# Patient Record
Sex: Female | Born: 1960 | Race: White | Hispanic: No | Marital: Single | Smoking: Never smoker
Health system: Southern US, Community
[De-identification: ages and names within clinical notes are randomized; demographics above are authoritative.]

## PROBLEM LIST (undated history)

## (undated) DIAGNOSIS — E079 Disorder of thyroid, unspecified: Secondary | ICD-10-CM

## (undated) DIAGNOSIS — B029 Zoster without complications: Secondary | ICD-10-CM

## (undated) HISTORY — PX: TUBAL LIGATION: SHX77

## (undated) HISTORY — DX: Zoster without complications: B02.9

## (undated) HISTORY — PX: SKIN CANCER DESTRUCTION: SHX778

---

## 2008-06-08 ENCOUNTER — Ambulatory Visit (HOSPITAL_COMMUNITY): Admission: RE | Admit: 2008-06-08 | Discharge: 2008-06-08 | Payer: Self-pay | Admitting: Family Medicine

## 2011-01-06 ENCOUNTER — Encounter: Payer: Self-pay | Admitting: Family Medicine

## 2014-07-04 DIAGNOSIS — C4491 Basal cell carcinoma of skin, unspecified: Secondary | ICD-10-CM

## 2014-07-04 HISTORY — DX: Basal cell carcinoma of skin, unspecified: C44.91

## 2014-08-22 ENCOUNTER — Ambulatory Visit (HOSPITAL_COMMUNITY)
Admission: RE | Admit: 2014-08-22 | Discharge: 2014-08-22 | Disposition: A | Discharge status: Home / Self Care | Payer: Managed Care, Other (non HMO) | Source: Ambulatory Visit | Attending: Family Medicine | Admitting: Family Medicine

## 2014-08-22 ENCOUNTER — Other Ambulatory Visit (HOSPITAL_COMMUNITY): Payer: Self-pay | Admitting: Family Medicine

## 2014-08-22 ENCOUNTER — Encounter (HOSPITAL_COMMUNITY): Payer: Self-pay

## 2014-08-22 DIAGNOSIS — R29898 Other symptoms and signs involving the musculoskeletal system: Secondary | ICD-10-CM

## 2014-08-22 DIAGNOSIS — M545 Low back pain, unspecified: Secondary | ICD-10-CM | POA: Diagnosis present

## 2014-08-22 DIAGNOSIS — M79604 Pain in right leg: Secondary | ICD-10-CM

## 2014-08-22 DIAGNOSIS — M79609 Pain in unspecified limb: Secondary | ICD-10-CM | POA: Insufficient documentation

## 2014-08-22 DIAGNOSIS — M5137 Other intervertebral disc degeneration, lumbosacral region: Secondary | ICD-10-CM | POA: Insufficient documentation

## 2014-08-22 DIAGNOSIS — M51379 Other intervertebral disc degeneration, lumbosacral region without mention of lumbar back pain or lower extremity pain: Secondary | ICD-10-CM | POA: Insufficient documentation

## 2015-02-07 ENCOUNTER — Emergency Department (HOSPITAL_COMMUNITY)
Admission: EM | Admit: 2015-02-07 | Discharge: 2015-02-07 | Disposition: A | Discharge status: Home / Self Care | Payer: Managed Care, Other (non HMO) | Attending: Emergency Medicine | Admitting: Emergency Medicine

## 2015-02-07 ENCOUNTER — Encounter (HOSPITAL_COMMUNITY): Payer: Self-pay | Admitting: Emergency Medicine

## 2015-02-07 ENCOUNTER — Emergency Department (HOSPITAL_COMMUNITY): Payer: Managed Care, Other (non HMO)

## 2015-02-07 DIAGNOSIS — E079 Disorder of thyroid, unspecified: Secondary | ICD-10-CM | POA: Diagnosis not present

## 2015-02-07 DIAGNOSIS — R42 Dizziness and giddiness: Secondary | ICD-10-CM | POA: Diagnosis not present

## 2015-02-07 DIAGNOSIS — Z79899 Other long term (current) drug therapy: Secondary | ICD-10-CM | POA: Insufficient documentation

## 2015-02-07 DIAGNOSIS — R0789 Other chest pain: Secondary | ICD-10-CM | POA: Insufficient documentation

## 2015-02-07 DIAGNOSIS — R079 Chest pain, unspecified: Secondary | ICD-10-CM | POA: Diagnosis present

## 2015-02-07 HISTORY — DX: Disorder of thyroid, unspecified: E07.9

## 2015-02-07 LAB — TROPONIN I

## 2015-02-07 LAB — CBC WITH DIFFERENTIAL/PLATELET
BASOS ABS: 0 10*3/uL (ref 0.0–0.1)
BASOS PCT: 0 % (ref 0–1)
EOS ABS: 0.1 10*3/uL (ref 0.0–0.7)
Eosinophils Relative: 1 % (ref 0–5)
HEMATOCRIT: 39.3 % (ref 36.0–46.0)
HEMOGLOBIN: 13.3 g/dL (ref 12.0–15.0)
LYMPHS PCT: 7 % — AB (ref 12–46)
Lymphs Abs: 0.8 10*3/uL (ref 0.7–4.0)
MCH: 32.5 pg (ref 26.0–34.0)
MCHC: 33.8 g/dL (ref 30.0–36.0)
MCV: 96.1 fL (ref 78.0–100.0)
MONOS PCT: 10 % (ref 3–12)
Monocytes Absolute: 1.1 10*3/uL — ABNORMAL HIGH (ref 0.1–1.0)
NEUTROS ABS: 9.6 10*3/uL — AB (ref 1.7–7.7)
Neutrophils Relative %: 82 % — ABNORMAL HIGH (ref 43–77)
Platelets: 224 10*3/uL (ref 150–400)
RBC: 4.09 MIL/uL (ref 3.87–5.11)
RDW: 12.4 % (ref 11.5–15.5)
WBC: 11.6 10*3/uL — ABNORMAL HIGH (ref 4.0–10.5)

## 2015-02-07 LAB — COMPREHENSIVE METABOLIC PANEL
ALT: 18 U/L (ref 0–35)
ANION GAP: 10 (ref 5–15)
AST: 20 U/L (ref 0–37)
Albumin: 4.2 g/dL (ref 3.5–5.2)
Alkaline Phosphatase: 61 U/L (ref 39–117)
BILIRUBIN TOTAL: 0.7 mg/dL (ref 0.3–1.2)
BUN: 15 mg/dL (ref 6–23)
CALCIUM: 8.8 mg/dL (ref 8.4–10.5)
CHLORIDE: 102 mmol/L (ref 96–112)
CO2: 28 mmol/L (ref 19–32)
CREATININE: 0.68 mg/dL (ref 0.50–1.10)
Glucose, Bld: 108 mg/dL — ABNORMAL HIGH (ref 70–99)
Potassium: 3.8 mmol/L (ref 3.5–5.1)
Sodium: 140 mmol/L (ref 135–145)
Total Protein: 7.3 g/dL (ref 6.0–8.3)

## 2015-02-07 NOTE — ED Notes (Signed)
PT c/o centralized chest pain on inhalation x2 days and states pain radiates into her back.

## 2015-02-07 NOTE — ED Provider Notes (Signed)
CSN: [id number]     Arrival date & time 02/07/15  1217 History  This chart was scribed for Richarda Blade, MD by Rayfield Citizen, ED Scribe. This patient was seen in room APA07/APA07 and the patient's care was started at 1:34 PM.    Chief Complaint  Patient presents with  . Chest Pain   Patient is a 54 y.o. female presenting with chest pain. The history is provided by the patient. No language interpreter was used.  Chest Pain Associated symptoms: dizziness   Associated symptoms: no nausea and not vomiting      HPI Comments: Kayla Patel is a 54 y.o. female with past medical history of thyroid disease who presents to the Emergency Department complaining of 2 days of centralized chest "tightness", worse on inhalation and radiating to her back. She reports occasional dizziness. Patient explains she took BC powders and Aleve without relief. She denies nausea, vomiting, urinary symptoms.   She reports an episode of "severe" chest congestion last month; her cough has since improved, though she worries her current symptoms may be related. She treated her symptoms at that time with Mucinex.   PCP is McInnis. She is a nonsmoker; she denies significant or abnormal stress. 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   Past Medical History  Diagnosis Date  . Thyroid disease    Past Surgical History  Procedure Laterality Date  . Tubal ligation     No family history on file. History  Substance Use Topics  . Smoking status: Never Smoker   . Smokeless tobacco: Not on file  . Alcohol Use: No   OB History    Gravida Para Term Preterm AB TAB SAB Ectopic Multiple Living            2     Review of Systems  Cardiovascular: Positive for chest pain.  Gastrointestinal: Negative for nausea and vomiting.  Genitourinary: Negative for dysuria and hematuria.  Neurological: Positive for dizziness.   Allergies  Review of patient's  allergies indicates no known allergies.  Home Medications   Prior to Admission medications   Medication Sig Start Date End Date Taking? Authorizing Provider  Aspirin-Salicylamide-Caffeine (BC HEADACHE POWDER PO) Take 1 Package by mouth daily as needed (pain).   Yes Historical Provider, MD  cyclobenzaprine (FLEXERIL) 10 MG tablet Take 1 tablet by mouth daily as needed. 02/04/15  Yes Historical Provider, MD  naproxen sodium (ANAPROX) 220 MG tablet Take 220 mg by mouth daily as needed (pain).   Yes Historical Provider, MD  SYNTHROID 75 MCG tablet Take 75 mcg by mouth daily. 01/10/15  Yes Historical Provider, MD   BP 126/79 mmHg  Pulse 105  Temp(Src) 99 F (37.2 C) (Oral)  Resp 18  Ht 5\' 5"  (1.651 m)  Wt 160 lb (72.576 kg)  BMI 26.63 kg/m2  SpO2 100%  LMP 01/24/2015 Physical Exam  Constitutional: She is oriented to person, place, and time. She appears well-developed and well-nourished.  HENT:  Head: Normocephalic and atraumatic.  Eyes: Conjunctivae and EOM are normal. Pupils are equal, round, and reactive to light.  Neck: Normal range of motion and phonation normal. Neck supple.  Cardiovascular: Normal rate and regular rhythm.   Pulmonary/Chest: Effort normal and breath sounds normal. She has no wheezes. She has no rales. She exhibits tenderness (Mild bilateral anterior).  Abdominal: Soft. She exhibits no distension. There is no tenderness. There is no guarding.  Musculoskeletal: Normal range of  motion.  Neurological: She is alert and oriented to person, place, and time. She exhibits normal muscle tone.  Skin: Skin is warm and dry.  Psychiatric: Her behavior is normal. Judgment and thought content normal. Her mood appears anxious.  Nursing note and vitals reviewed.   ED Course  Procedures   DIAGNOSTIC STUDIES: Oxygen Saturation is 100% on RA, normal by my interpretation.    COORDINATION OF CARE:  Medications - No data to display  Patient Vitals for the past 24 hrs:  BP Temp  Temp src Pulse Resp SpO2 Height Weight  02/07/15 1230 126/79 mmHg 99 F (37.2 C) Oral 105 18 100 % 5\' 5"  (1.651 m) 160 lb (72.576 kg)   1:40 PM Discussed treatment plan with pt at bedside and pt agreed to plan.   1:51 PM Reevaluation with update and discussion. After initial assessment and treatment, an updated evaluation reveals no further complaints.  Findings discussed with patient, all questions answered. [place] Review Labs Reviewed  CBC WITH DIFFERENTIAL/PLATELET - Abnormal; Notable for the following:    WBC 11.6 (*)    Neutrophils Relative % 82 (*)    Neutro Abs 9.6 (*)    Lymphocytes Relative 7 (*)    Monocytes Absolute 1.1 (*)    All other components within normal limits  COMPREHENSIVE METABOLIC PANEL - Abnormal; Notable for the following:    Glucose, Bld 108 (*)    All other components within normal limits  TROPONIN I   Imaging Review Dg Chest 2 View  02/07/2015   CLINICAL DATA:  Chest pain radiating to back for 1 day  EXAM: CHEST  2 VIEW  COMPARISON:  None.  FINDINGS: There is no edema or consolidation. The heart size and pulmonary vascularity are normal. No adenopathy. No bone lesions.  IMPRESSION: No edema or consolidation.   Electronically Signed   By: Lowella Grip III M.D.   On: 02/07/2015 13:32     EKG Interpretation  Date/Time:  Wednesday February 07 2015 12:25:34 EST Ventricular Rate:  106 PR Interval:  150 QRS Duration: 92 QT Interval:  346 QTC Calculation: 459 R Axis:   72 Text Interpretation:  Sinus tachycardia Cannot rule out Anterior infarct , age undetermined Abnormal ECG No old tracing to compare Confirmed by [facility]  MD, Courtnei Ruddell [id number]) on 02/07/2015 1:30:26 PM      MDM   Final diagnoses:  Chest wall pain   Evaluation consistent with chest wall pain.  Doubt ACS, PE or pneumonia.  Nursing Notes Reviewed/ Care Coordinated Applicable Imaging Reviewed Interpretation of Laboratory Data incorporated into ED treatment  The  patient appears reasonably screened and/or stabilized for discharge and I doubt any other medical condition or other [facility] requiring further screening, evaluation, or treatment in the ED at this time prior to discharge.  Plan: Home Medications- OTC analgesia; Home Treatments- rest, heat; return here if the recommended treatment, does not improve the symptoms; Recommended follow up- PCP prn   I personally performed the services described in this documentation, which was scribed in my presence. The recorded information has been reviewed and is accurate.       Richarda Blade, MD 02/07/15 [phone]

## 2015-02-07 NOTE — Discharge Instructions (Signed)
°  Take  Ibuprofen 400 mg 3 times a day for pain Use heat on the sore area 3 times a day   Chest Wall Pain Chest wall pain is pain in or around the bones and muscles of your chest. It may take up to 6 weeks to get better. It may take longer if you must stay physically active in your work and activities.  CAUSES  Chest wall pain may happen on its own. However, it may be caused by:  A viral illness like the flu.  Injury.  Coughing.  Exercise.  Arthritis.  Fibromyalgia.  Shingles. HOME CARE INSTRUCTIONS   Avoid overtiring physical activity. Try not to strain or perform activities that cause pain. This includes any activities using your chest or your abdominal and side muscles, especially if heavy weights are used.  Put ice on the sore area.  Put ice in a plastic bag.  Place a towel between your skin and the bag.  Leave the ice on for 15-20 minutes per hour while awake for the first 2 days.  Only take over-the-counter or prescription medicines for pain, discomfort, or fever as directed by your caregiver. SEEK IMMEDIATE MEDICAL CARE IF:   Your pain increases, or you are very uncomfortable.  You have a fever.  Your chest pain becomes worse.  You have new, unexplained symptoms.  You have nausea or vomiting.  You feel sweaty or lightheaded.  You have a cough with phlegm (sputum), or you cough up blood. MAKE SURE YOU:   Understand these instructions.  Will watch your condition.  Will get help right away if you are not doing well or get worse. Document Released: 12/01/2005 Document Revised: 02/23/2012 Document Reviewed: 07/28/2011 [facility] Patient Information 2015 [place], [place]. This information is not intended to replace advice given to you by your health care provider. Make sure you discuss any questions you have with your health care provider.

## 2015-02-13 ENCOUNTER — Encounter: Payer: Self-pay | Admitting: Obstetrics & Gynecology

## 2015-03-15 ENCOUNTER — Encounter: Payer: Self-pay | Admitting: Obstetrics & Gynecology

## 2015-04-18 ENCOUNTER — Ambulatory Visit (AMBULATORY_SURGERY_CENTER): Payer: Self-pay

## 2015-04-18 VITALS — Ht 65.5 in | Wt 154.0 lb

## 2015-04-18 DIAGNOSIS — Z1211 Encounter for screening for malignant neoplasm of colon: Secondary | ICD-10-CM

## 2015-04-18 MED ORDER — MOVIPREP 100 G PO SOLR: 1.0000 | Freq: Once | ORAL | Status: DC

## 2015-04-18 NOTE — Progress Notes (Signed)
No allergies to eggs or soy No diet/weight loss meds No home oxygen No past problems with anesthesia   

## 2015-04-19 ENCOUNTER — Encounter: Payer: Self-pay | Admitting: Internal Medicine

## 2015-05-02 ENCOUNTER — Encounter: Payer: Managed Care, Other (non HMO) | Admitting: Internal Medicine

## 2015-05-11 ENCOUNTER — Encounter: Payer: Self-pay | Admitting: Internal Medicine

## 2015-05-11 ENCOUNTER — Ambulatory Visit (AMBULATORY_SURGERY_CENTER): Payer: Managed Care, Other (non HMO) | Admitting: Internal Medicine

## 2015-05-11 VITALS — BP 117/72 | HR 67 | Temp 96.9°F | Resp 25 | Ht 65.5 in | Wt 154.0 lb

## 2015-05-11 DIAGNOSIS — Z1211 Encounter for screening for malignant neoplasm of colon: Secondary | ICD-10-CM | POA: Diagnosis present

## 2015-05-11 MED ORDER — SODIUM CHLORIDE 0.9 % IV SOLN: 500.0000 mL | INTRAVENOUS | Status: DC

## 2015-05-11 NOTE — Patient Instructions (Signed)
YOU HAD AN ENDOSCOPIC PROCEDURE TODAY AT [facility]:   Refer to the procedure report that was given to you for any specific questions about what was found during the examination.  If the procedure report does not answer your questions, please call your gastroenterologist to clarify.  If you requested that your care partner not be given the details of your procedure findings, then the procedure report has been included in a sealed envelope for you to review at your convenience later.  YOU SHOULD EXPECT: Some feelings of bloating in the abdomen. Passage of more gas than usual.  Walking can help get rid of the air that was put into your GI tract during the procedure and reduce the bloating. If you had a lower endoscopy (such as a colonoscopy or flexible sigmoidoscopy) you may notice spotting of blood in your stool or on the toilet paper. If you underwent a bowel prep for your procedure, you may not have a normal bowel movement for a few days.  Please Note:  You might notice some irritation and congestion in your nose or some drainage.  This is from the oxygen used during your procedure.  There is no need for concern and it should clear up in a day or so.  SYMPTOMS TO REPORT IMMEDIATELY:   Following lower endoscopy (colonoscopy or flexible sigmoidoscopy):  Excessive amounts of blood in the stool  Significant tenderness or worsening of abdominal pains  Swelling of the abdomen that is new, acute  Fever of 100F or higher   For urgent or emergent issues, a gastroenterologist can be reached at any hour by calling [phone].   DIET: Your first meal following the procedure should be a small meal and then it is ok to progress to your normal diet. Heavy or fried foods are harder to digest and may make you feel nauseous or bloated.  Likewise, meals heavy in dairy and vegetables can increase bloating.  Drink plenty of fluids but you should avoid alcoholic beverages for 24  hours.  ACTIVITY:  You should plan to take it easy for the rest of today and you should NOT DRIVE or use heavy machinery until tomorrow (because of the sedation medicines used during the test).    FOLLOW UP: Our staff will call the number listed on your records the next business day following your procedure to check on you and address any questions or concerns that you may have regarding the information given to you following your procedure. If we do not reach you, we will leave a message.  However, if you are feeling well and you are not experiencing any problems, there is no need to return our call.  We will assume that you have returned to your regular daily activities without incident.  If any biopsies were taken you will be contacted by phone or by letter within the next 1-3 weeks.  Please call us at [phone] if you have not heard about the biopsies in 3 weeks.    SIGNATURES/CONFIDENTIALITY: You and/or your care partner have signed paperwork which will be entered into your electronic medical record.  These signatures attest to the fact that that the information above on your After Visit Summary has been reviewed and is understood.  Full responsibility of the confidentiality of this discharge information lies with you and/or your care-partner.  Diverticulosis and high fiber diet information given.

## 2015-05-11 NOTE — Op Note (Signed)
[place]  [address] [place], [id number]   COLONOSCOPY PROCEDURE REPORT  PATIENT: Kayla Patel, Kayla Patel  MR#: [id number] BIRTHDATE: [birth year] , 34  yrs. old GENDER: female ENDOSCOPIST: Lafayette Dragon, MD REFERRED YS:HUOHF Everette Rank, M.D. PROCEDURE DATE:  05/11/2015 PROCEDURE:   Colonoscopy, screening First Screening Colonoscopy - Avg.  risk and is 50 yrs.  old or older Yes.  Prior Negative Screening - Now for repeat screening. N/A  History of Adenoma - Now for follow-up colonoscopy & has been > or = to 3 yrs.  N/A  Polyps removed today? No Recommend repeat exam, <10 yrs? No ASA CLASS:   Class I INDICATIONS:Screening for colonic neoplasia and Colorectal Neoplasm Risk Assessment for this procedure is average risk. MEDICATIONS: Monitored anesthesia care and Propofol 200 mg IV  DESCRIPTION OF PROCEDURE:   After the risks benefits and alternatives of the procedure were thoroughly explained, informed consent was obtained.  The digital rectal exam revealed no abnormalities of the rectum.   The LB [id number]-[id number]  endoscope was introduced through the anus and advanced to the cecum, which was identified by both the appendix and ileocecal valve. No adverse events experienced.   The quality of the prep was good.  (MoviPrep was used)  The instrument was then slowly withdrawn as the colon was fully examined. Estimated blood loss is zero unless otherwise noted in this procedure report.      COLON FINDINGS: There was moderate diverticulosis noted in the sigmoid colon and descending colon with associated muscular hypertrophy, tortuosity and angulation.  Retroflexed views revealed no abnormalities. The time to cecum = 5.54 Withdrawal time = 6.05 The scope was withdrawn and the procedure completed. COMPLICATIONS: There were no immediate complications.  ENDOSCOPIC IMPRESSION: There was moderate diverticulosis noted in the sigmoid colon and descending  colon  RECOMMENDATIONS: High fiber diet Recall colonoscopy in 10 years  eSigned:  Lafayette Dragon, MD 05/11/2015 10:14 AM   cc:

## 2015-05-11 NOTE — Progress Notes (Signed)
A/ox3 pleased with MAC, report to 

## 2015-05-15 ENCOUNTER — Telehealth: Payer: Self-pay | Admitting: *Deleted

## 2015-05-15 NOTE — Telephone Encounter (Signed)
No answer, left message to call if questions or concerns. 

## 2016-06-19 ENCOUNTER — Encounter: Payer: Self-pay | Admitting: Obstetrics & Gynecology

## 2016-11-07 IMAGING — CR DG CHEST 2V
2 series · 2 of 2 positions shown · non-contrast
Comparison: None.

CLINICAL DATA: Chest pain radiating to back for 1 day

EXAM:
CHEST  2 VIEW

[view not recorded (1 of 2)]
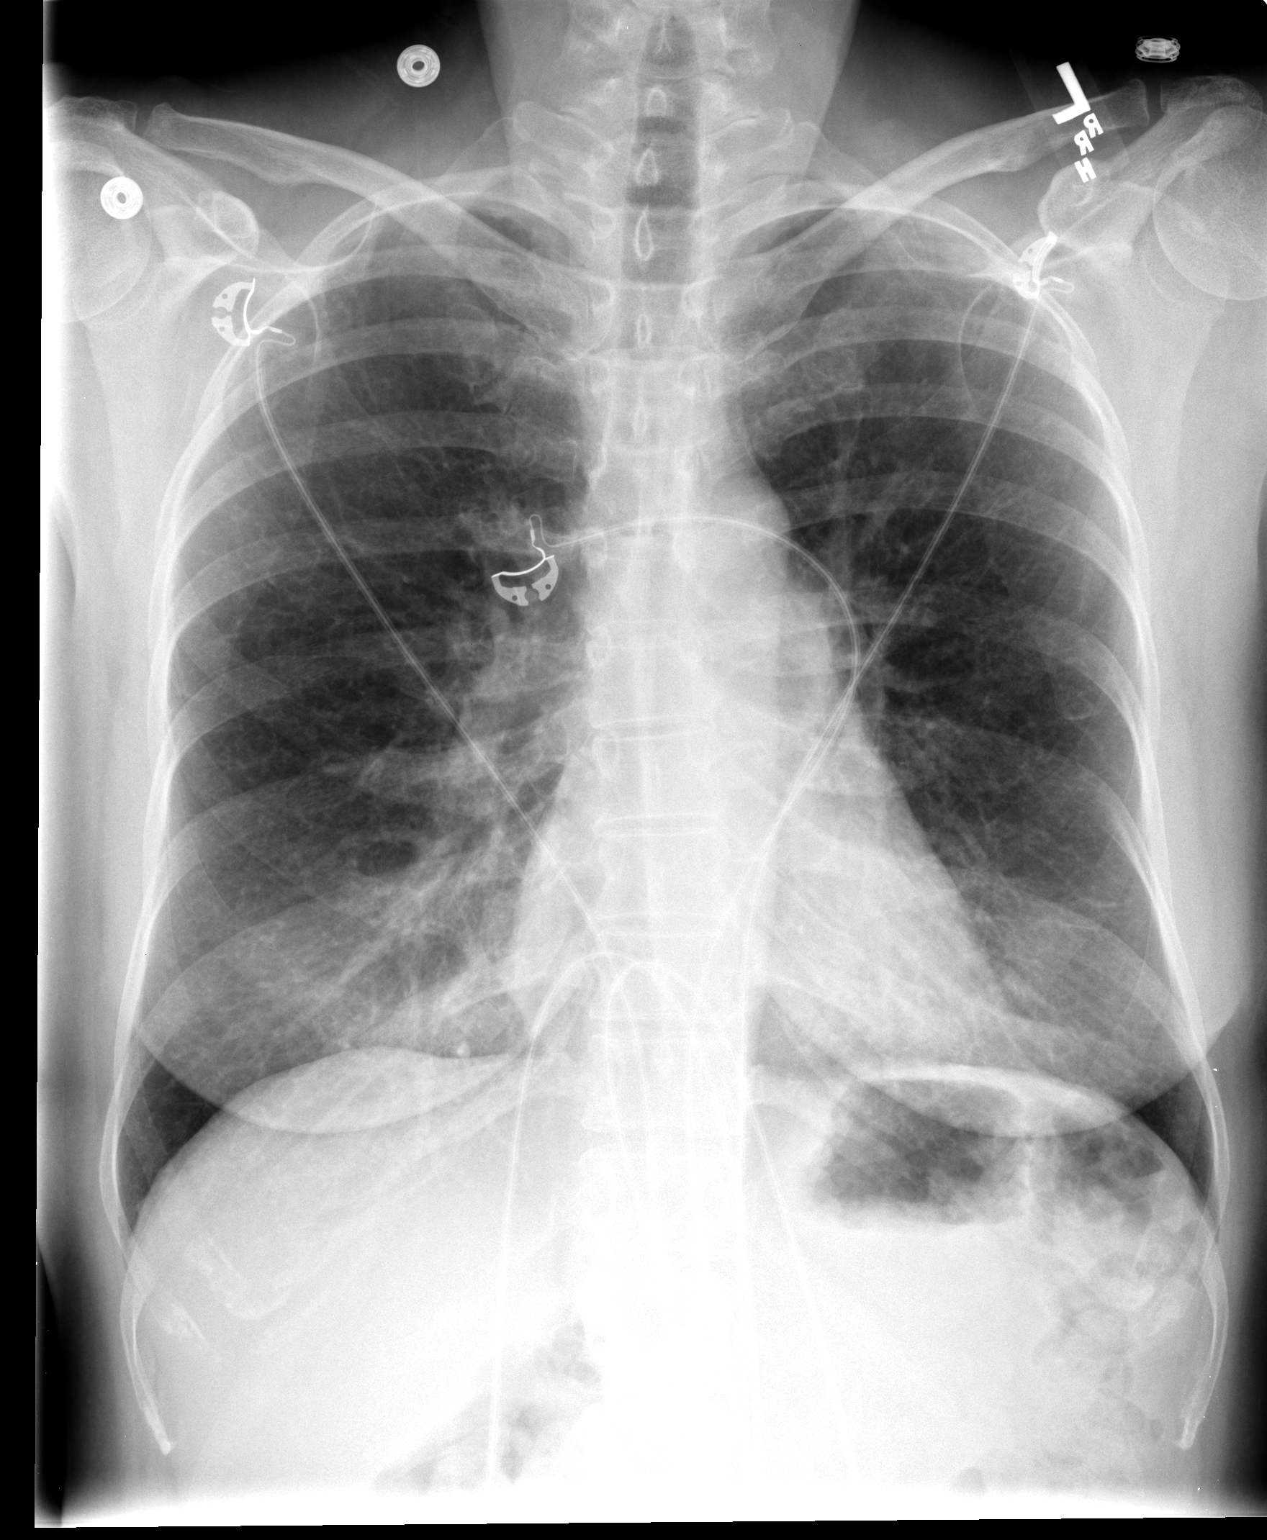

[view not recorded (2 of 2)]
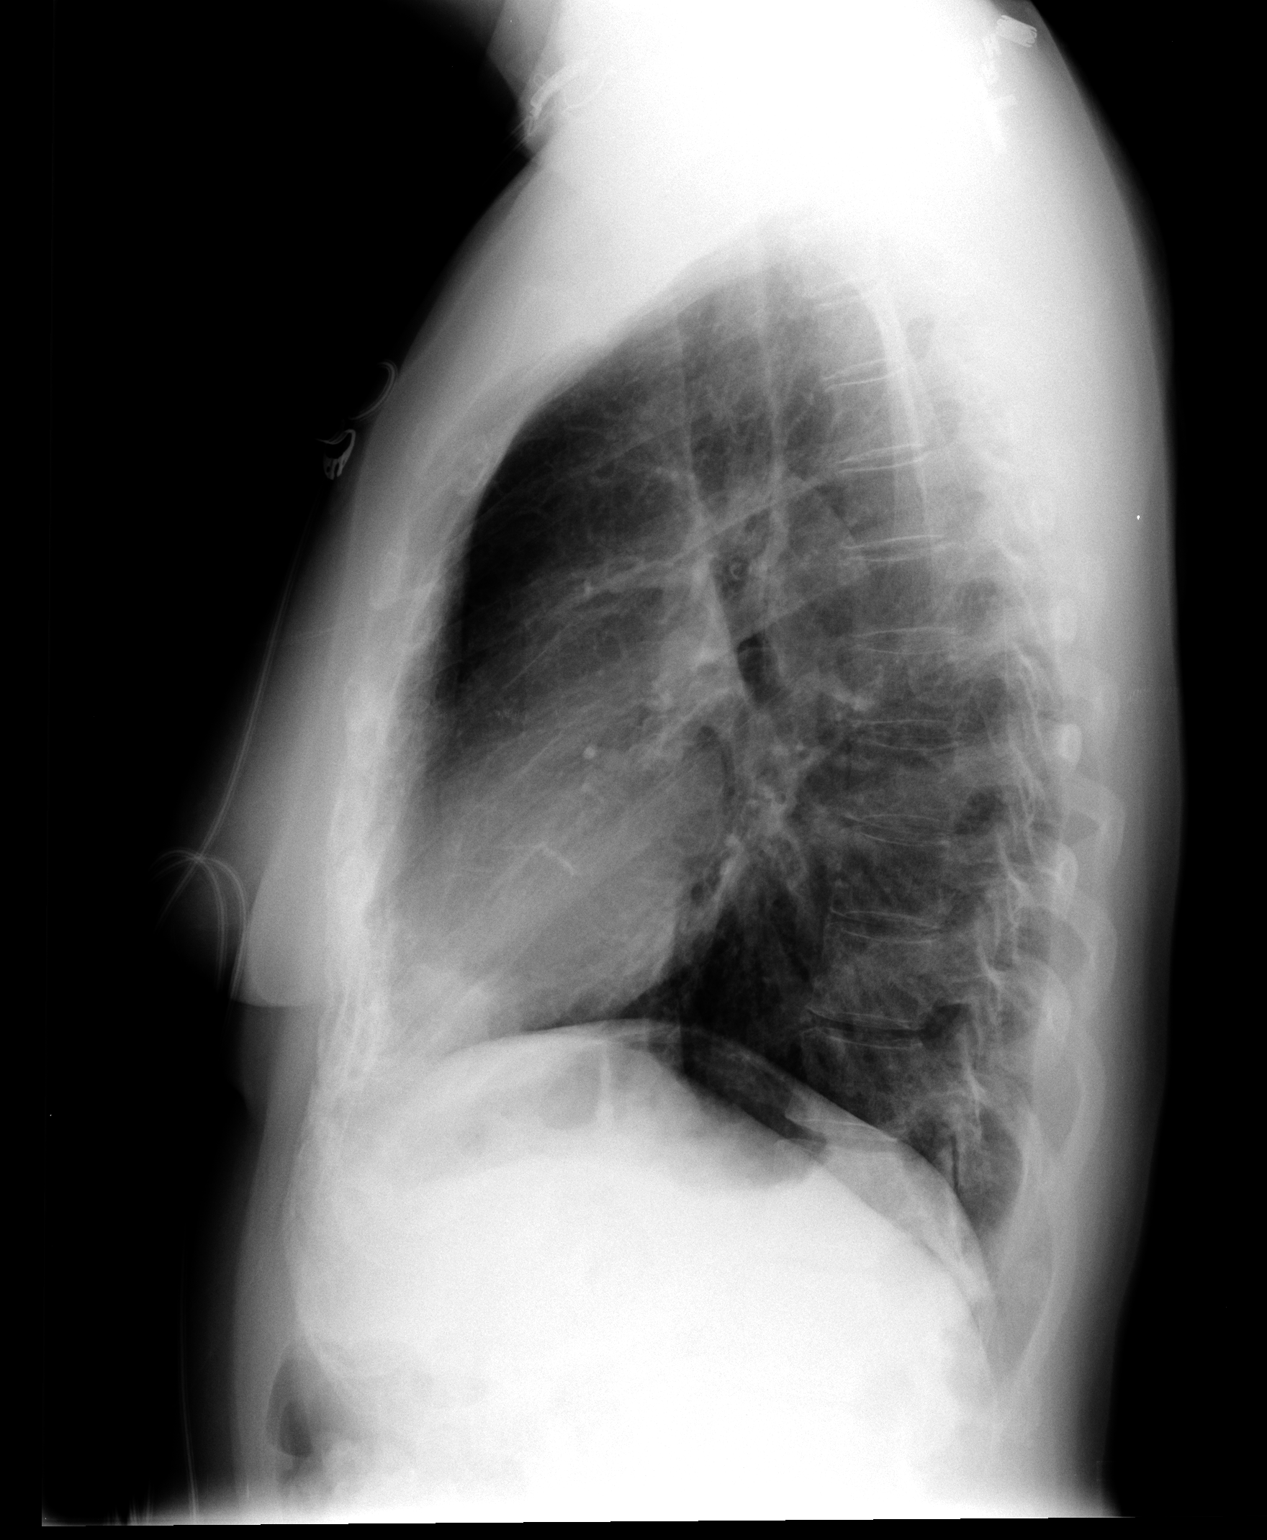

[2 of 2 positions shown; findings below may reference images not displayed]

FINDINGS: There is no edema or consolidation. The heart size and pulmonary
vascularity are normal. No adenopathy. No bone lesions.
IMPRESSION: No edema or consolidation.

## 2018-05-07 ENCOUNTER — Encounter: Payer: Self-pay | Admitting: Obstetrics & Gynecology

## 2018-07-23 ENCOUNTER — Encounter: Payer: Self-pay | Admitting: Obstetrics & Gynecology

## 2018-08-09 ENCOUNTER — Telehealth: Payer: Self-pay | Admitting: *Deleted

## 2018-08-09 NOTE — Telephone Encounter (Signed)
Annual on 09/20/18, patient called requesting refill on Valtrex 1 gram, paper chart has arrived. I left message to call to find out what pharmacy to send Rx.

## 2018-08-10 MED ORDER — VALACYCLOVIR HCL 1 G PO TABS: ORAL_TABLET | ORAL | 0 refills | Status: DC

## 2018-08-10 NOTE — Telephone Encounter (Signed)
Patient called back with pharmacy.

## 2018-09-17 ENCOUNTER — Encounter: Payer: Self-pay | Admitting: Obstetrics & Gynecology

## 2018-09-20 ENCOUNTER — Encounter: Payer: Self-pay | Admitting: Obstetrics & Gynecology

## 2018-10-22 ENCOUNTER — Ambulatory Visit (INDEPENDENT_AMBULATORY_CARE_PROVIDER_SITE_OTHER): Payer: BLUE CROSS/BLUE SHIELD | Admitting: Obstetrics & Gynecology

## 2018-10-22 ENCOUNTER — Encounter: Payer: Self-pay | Admitting: Obstetrics & Gynecology

## 2018-10-22 VITALS — BP 132/84 | Ht 63.0 in | Wt 164.0 lb

## 2018-10-22 DIAGNOSIS — Z1151 Encounter for screening for human papillomavirus (HPV): Secondary | ICD-10-CM | POA: Diagnosis not present

## 2018-10-22 DIAGNOSIS — E663 Overweight: Secondary | ICD-10-CM | POA: Diagnosis not present

## 2018-10-22 DIAGNOSIS — Z780 Asymptomatic menopausal state: Secondary | ICD-10-CM

## 2018-10-22 DIAGNOSIS — Z01419 Encounter for gynecological examination (general) (routine) without abnormal findings: Secondary | ICD-10-CM

## 2018-10-22 NOTE — Patient Instructions (Signed)
1. Encounter for routine gynecological examination with Papanicolaou smear of cervix Normal gynecologic exam.  Pap with high-risk HPV done today.  Breast exam normal.  Will schedule screening mammogram at the breast center now.  Health labs with family physician.  2. Postmenopausal Well on no hormone replacement therapy.  No postmenopausal bleeding.  Recommend vitamin D supplements, calcium intake of 1.5 g/day and regular weightbearing physical activity.  3. Overweight (BMI 25.0-29.9) Lower calorie/carb diet such as [employer].  Aerobic physical activity 5 times a week and weightlifting every 2 days.  Jessilynn, it was a pleasure seeing you today!  I will inform you of your results as soon as they are available.

## 2018-10-22 NOTE — Progress Notes (Signed)
Kayla Patel 1961/08/26 [id number]   History:    57 y.o. G2P2L2 Married.  Has 5 grand-children.  RP:  Established patient presenting for annual gyn exam   HPI: Menopause, well on no HRT.  No PMB.  No pelvic pain.  No pain with IC.  Breasts wnl.  Urine/BMs wnl.  BMI 29.05.  Mild physical activity.  Health Labs with Fam MD.  Past medical history,surgical history, family history and social history were all reviewed and documented in the EPIC chart.  Gynecologic History Patient's last menstrual period was 01/24/2015. Contraception: post menopausal status Last Pap: 3 yrs ago, normal  Last mammogram: 3 yrs ago, normal Bone Density: Never Colonoscopy: 2016  Obstetric History OB History  Gravida Para Term Preterm AB Living  2 2       2   SAB TAB Ectopic Multiple Live Births               # Outcome Date GA Lbr Len/2nd Weight Sex Delivery Anes PTL Lv  2 Para           1 Para              ROS: A ROS was performed and pertinent positives and negatives are included in the history.  GENERAL: No fevers or chills. HEENT: No change in vision, no earache, sore throat or sinus congestion. NECK: No pain or stiffness. CARDIOVASCULAR: No chest pain or pressure. No palpitations. PULMONARY: No shortness of breath, cough or wheeze. GASTROINTESTINAL: No abdominal pain, nausea, vomiting or diarrhea, melena or bright red blood per rectum. GENITOURINARY: No urinary frequency, urgency, hesitancy or dysuria. MUSCULOSKELETAL: No joint or muscle pain, no back pain, no recent trauma. DERMATOLOGIC: No rash, no itching, no lesions. ENDOCRINE: No polyuria, polydipsia, no heat or cold intolerance. No recent change in weight. HEMATOLOGICAL: No anemia or easy bruising or bleeding. NEUROLOGIC: No headache, seizures, numbness, tingling or weakness. PSYCHIATRIC: No depression, no loss of interest in normal activity or change in sleep pattern.     Exam:   BP 132/84   Ht 5\' 3"  (1.6 m)   Wt 164 lb (74.4 kg)    LMP 01/24/2015   BMI 29.05 kg/m   Body mass index is 29.05 kg/m.  General appearance : Well developed well nourished female. No acute distress HEENT: Eyes: no retinal hemorrhage or exudates,  Neck supple, trachea midline, no carotid bruits, no thyroidmegaly Lungs: Clear to auscultation, no rhonchi or wheezes, or rib retractions  Heart: Regular rate and rhythm, no murmurs or gallops Breast:Examined in sitting and supine position were symmetrical in appearance, no palpable masses or tenderness,  no skin retraction, no nipple inversion, no nipple discharge, no skin discoloration, no axillary or supraclavicular lymphadenopathy Abdomen: no palpable masses or tenderness, no rebound or guarding Extremities: no edema or skin discoloration or tenderness  Pelvic: Vulva: Normal             Vagina: No gross lesions or discharge  Cervix: No gross lesions or discharge.  Pap/HPV HR done  Uterus  AV, normal size, shape and consistency, non-tender and mobile  Adnexa  Without masses or tenderness  Anus: Normal   Assessment/Plan:  57 y.o. female for annual exam   1. Encounter for routine gynecological examination with Papanicolaou smear of cervix Normal gynecologic exam.  Pap with high-risk HPV done today.  Breast exam normal.  Will schedule screening mammogram at the breast center now.  Health labs with family physician.  2. Postmenopausal Well on no  hormone replacement therapy.  No postmenopausal bleeding.  Recommend vitamin D supplements, calcium intake of 1.5 g/day and regular weightbearing physical activity.  3. Overweight (BMI 25.0-29.9) Lower calorie/carb diet such as [employer].  Aerobic physical activity 5 times a week and weightlifting every 2 days.  Princess Bruins MD, 12:54 PM 10/22/2018

## 2018-10-26 LAB — PAP, TP IMAGING W/ HPV RNA, RFLX HPV TYPE 16,18/45: HPV DNA High Risk: NOT DETECTED

## 2018-11-01 ENCOUNTER — Other Ambulatory Visit: Payer: Self-pay | Admitting: Obstetrics & Gynecology

## 2018-12-02 ENCOUNTER — Telehealth: Payer: Self-pay | Admitting: *Deleted

## 2018-12-02 NOTE — Telephone Encounter (Signed)
Patient called and left message on voicemail she has been having trouble with my chart and not able to read my chart message regarding normal pap results. I called patient to relay, however her voicemail box was full.

## 2019-07-25 ENCOUNTER — Other Ambulatory Visit: Payer: Self-pay | Admitting: Obstetrics & Gynecology

## 2019-10-25 ENCOUNTER — Encounter: Payer: BLUE CROSS/BLUE SHIELD | Admitting: Obstetrics & Gynecology

## 2020-01-19 ENCOUNTER — Encounter: Payer: BC Managed Care – PPO | Admitting: Obstetrics & Gynecology

## 2020-02-22 ENCOUNTER — Other Ambulatory Visit: Payer: Self-pay

## 2020-02-23 ENCOUNTER — Ambulatory Visit (INDEPENDENT_AMBULATORY_CARE_PROVIDER_SITE_OTHER): Payer: BC Managed Care – PPO | Admitting: Obstetrics & Gynecology

## 2020-02-23 ENCOUNTER — Encounter: Payer: Self-pay | Admitting: Obstetrics & Gynecology

## 2020-02-23 VITALS — BP 128/80 | Ht 63.5 in | Wt 166.0 lb

## 2020-02-23 DIAGNOSIS — Z01419 Encounter for gynecological examination (general) (routine) without abnormal findings: Secondary | ICD-10-CM

## 2020-02-23 DIAGNOSIS — Z780 Asymptomatic menopausal state: Secondary | ICD-10-CM | POA: Diagnosis not present

## 2020-02-23 DIAGNOSIS — E663 Overweight: Secondary | ICD-10-CM | POA: Diagnosis not present

## 2020-02-23 NOTE — Progress Notes (Signed)
Kayla Patel 1961/06/29 [id number]   History:    59 y.o. G2P2L2 Single.  Has 5 grand-children.  RP:  Established patient presenting for annual gyn exam   HPI: Menopause, well on no HRT.  No PMB.  No pelvic pain.  Abstinent.  Breasts wnl.  Urine/BMs wnl.  BMI 28.94.  Mild physical activity.  Health Labs with Fam MD.   Past medical history,surgical history, family history and social history were all reviewed and documented in the EPIC chart.  Gynecologic History Patient's last menstrual period was 01/24/2015.  Obstetric History OB History  Gravida Para Term Preterm AB Living  2 2 2     2   SAB TAB Ectopic Multiple Live Births               # Outcome Date GA Lbr Len/2nd Weight Sex Delivery Anes PTL Lv  2 Term           1 Term              ROS: A ROS was performed and pertinent positives and negatives are included in the history.  GENERAL: No fevers or chills. HEENT: No change in vision, no earache, sore throat or sinus congestion. NECK: No pain or stiffness. CARDIOVASCULAR: No chest pain or pressure. No palpitations. PULMONARY: No shortness of breath, cough or wheeze. GASTROINTESTINAL: No abdominal pain, nausea, vomiting or diarrhea, melena or bright red blood per rectum. GENITOURINARY: No urinary frequency, urgency, hesitancy or dysuria. MUSCULOSKELETAL: No joint or muscle pain, no back pain, no recent trauma. DERMATOLOGIC: No rash, no itching, no lesions. ENDOCRINE: No polyuria, polydipsia, no heat or cold intolerance. No recent change in weight. HEMATOLOGICAL: No anemia or easy bruising or bleeding. NEUROLOGIC: No headache, seizures, numbness, tingling or weakness. PSYCHIATRIC: No depression, no loss of interest in normal activity or change in sleep pattern.     Exam:   BP 128/80 (BP Location: Right Arm, Patient Position: Sitting, Cuff Size: Normal)   Ht 5' 3.5" (1.613 m)   Wt 166 lb (75.3 kg)   LMP 01/24/2015   BMI 28.94 kg/m   Body mass index is 28.94  kg/m.  General appearance : Well developed well nourished female. No acute distress HEENT: Eyes: no retinal hemorrhage or exudates,  Neck supple, trachea midline, no carotid bruits, no thyroidmegaly Lungs: Clear to auscultation, no rhonchi or wheezes, or rib retractions  Heart: Regular rate and rhythm, no murmurs or gallops Breast:Examined in sitting and supine position were symmetrical in appearance, no palpable masses or tenderness,  no skin retraction, no nipple inversion, no nipple discharge, no skin discoloration, no axillary or supraclavicular lymphadenopathy Abdomen: no palpable masses or tenderness, no rebound or guarding Extremities: no edema or skin discoloration or tenderness  Pelvic: Vulva: Normal             Vagina: No gross lesions or discharge  Cervix: No gross lesions or discharge  Uterus  AV, normal size, shape and consistency, non-tender and mobile  Adnexa  Without masses or tenderness  Anus: Normal   Assessment/Plan:  59 y.o. female for annual exam   1. Well female exam with routine gynecological exam Normal gynecologic exam in menopause.  Pap test November 2019 was negative, no indication to repeat this year.  Breast exam normal.  Needs to schedule a screening mammogram now.  Colonoscopy in 2016.  Health labs with family physician.  2. Postmenopausal Well on no hormone replacement therapy.  No postmenopausal bleeding.  Vitamin D supplements, calcium  intake of 1200 mg daily and regular weightbearing physical activity is recommended.  3. Overweight (BMI 25.0-29.9) Recommend a slightly lower calorie/carb diet and increased fitness activities.  Other orders - levothyroxine (SYNTHROID) 25 MCG tablet; Take 25 mcg by mouth once a week.  Princess Bruins MD, 3:45 PM 02/23/2020

## 2020-02-25 ENCOUNTER — Encounter: Payer: Self-pay | Admitting: Obstetrics & Gynecology

## 2020-02-25 NOTE — Patient Instructions (Signed)
1. Well female exam with routine gynecological exam Normal gynecologic exam in menopause.  Pap test November 2019 was negative, no indication to repeat this year.  Breast exam normal.  Needs to schedule a screening mammogram now.  Colonoscopy in 2016.  Health labs with family physician.  2. Postmenopausal Well on no hormone replacement therapy.  No postmenopausal bleeding.  Vitamin D supplements, calcium intake of 1200 mg daily and regular weightbearing physical activity is recommended.  3. Overweight (BMI 25.0-29.9) Recommend a slightly lower calorie/carb diet and increased fitness activities.  Other orders - levothyroxine (SYNTHROID) 25 MCG tablet; Take 25 mcg by mouth once a week.  Kayla Patel, it was a pleasure seeing you today!

## 2020-04-12 ENCOUNTER — Ambulatory Visit (INDEPENDENT_AMBULATORY_CARE_PROVIDER_SITE_OTHER): Payer: BC Managed Care – PPO | Admitting: Physician Assistant

## 2020-04-12 ENCOUNTER — Other Ambulatory Visit: Payer: Self-pay

## 2020-04-12 ENCOUNTER — Encounter: Payer: Self-pay | Admitting: Physician Assistant

## 2020-04-12 DIAGNOSIS — Z1283 Encounter for screening for malignant neoplasm of skin: Secondary | ICD-10-CM

## 2020-04-12 DIAGNOSIS — L570 Actinic keratosis: Secondary | ICD-10-CM | POA: Diagnosis not present

## 2020-04-12 DIAGNOSIS — K130 Diseases of lips: Secondary | ICD-10-CM

## 2020-04-12 DIAGNOSIS — C44519 Basal cell carcinoma of skin of other part of trunk: Secondary | ICD-10-CM

## 2020-04-12 DIAGNOSIS — C4491 Basal cell carcinoma of skin, unspecified: Secondary | ICD-10-CM

## 2020-04-12 DIAGNOSIS — D485 Neoplasm of uncertain behavior of skin: Secondary | ICD-10-CM | POA: Diagnosis not present

## 2020-04-12 HISTORY — DX: Basal cell carcinoma of skin, unspecified: C44.91

## 2020-04-12 NOTE — Patient Instructions (Addendum)
Wound Care Instructions  1. Cleanse wound gently with soap and water once a day then pat dry with clean gauze. Apply a thing coat of Petrolatum (petroleum jelly, "Vaseline") over the wound (unless you have an allergy to this). We recommend that you use a new, sterile tube of Vaseline. Do not pick or remove scabs. Do not remove the yellow or white "healing tissue" from the base of the wound.  2. Cover the wound with fresh, clean, nonstick gauze and secure with paper tape. You may use Band-Aids in place of gauze and tape if the would is small enough, but would recommend trimming much of the tape off as there is often too much. Sometimes Band-Aids can irritate the skin.  3. You should call the office for your biopsy report after 1 week if you have not already been contacted.  4. If you experience any problems, such as abnormal amounts of bleeding, swelling, significant bruising, significant pain, or evidence of infection, please call the office immediately.  5. FOR ADULT SURGERY PATIENTS: If you need something for pain relief you may take 1 extra strength Tylenol (acetaminophen) AND 2 Ibuprofen (200mg each) together every 4 hours as needed for pain. (do not take these if you are allergic to them or if you have a reason you should not take them.) Typically, you may only need pain medication for 1 to 3 days.    Biopsy, Surgery (Curettage) & Surgery (Excision) Aftercare Instructions  1. Okay to remove bandage in 24 hours  2. Wash area with soap and water  3. Apply Vaseline to area twice daily until healed (Not Neosporin)  4. Okay to cover with a Band-Aid to decrease the chance of infection or prevent irritation from clothing; also it's okay to uncover lesion at home.  5. Suture instructions: return to our office in 7-10 or 10-14 days for a nurse visit for suture removal. Variable healing with sutures, if pain or itching occurs call our office. It's okay to shower or bathe 24 hours after sutures are  given.  6. The following risks may occur after a biopsy, curettage or excision: bleeding, scarring, discoloration, recurrence, infection (redness, yellow drainage, pain or swelling).  7. For questions, concerns and results call our office at Monday-Thursday before 4pm & Friday before 3pm. Biopsy results will be available in 1 week.   

## 2020-04-12 NOTE — Progress Notes (Signed)
New Patient   Subjective  Kayla Patel is a 59 y.o. female who presents for the following: Annual Exam (left post leg isk? right chin and left forearm).   The following portions of the chart were reviewed this encounter and updated as appropriate: Tobacco  Allergies  Meds  Problems  Med Hx  Surg Hx  Fam Hx      Objective  Well appearing patient in no apparent distress; mood and affect are within normal limits.  A full examination was performed including scalp, head, eyes, ears, nose, lips, neck, chest, axillae, abdomen, back, buttocks, bilateral upper extremities, bilateral lower extremities, hands, feet, fingers, toes, fingernails, and toenails. All findings within normal limits unless otherwise noted below.  Objective  Head - Anterior (Face), Left Breast, Lips, Right Breast: Erythematous patches with gritty scale.  Objective  Left Oral Commissure, Right Upper [place] Lip: Red and irritated  Objective  right Upper Arm - Posterior: Pearly papule with telangectasia.      Objective  Chest - Medial [facility]): Pearly papule with telangectasia.      Objective  Left mid back: Scaly erythematous macule      Objective  Right mid back: Pearly papule with telangectasia.      Objective  TBSE: No DN   Assessment & Plan  AK (actinic keratosis) (4) Head - Anterior (Face); Left Breast; Right Breast; Lips  Destruction of lesion - Head - Anterior (Face), Left Breast, Lips, Right Breast Complexity: simple   Destruction method: cryotherapy   Informed consent: discussed and consent obtained   Timeout:  patient name, date of birth, surgical site, and procedure verified Lesion destroyed using liquid nitrogen: Yes   Cryotherapy cycles:  1 Outcome: patient tolerated procedure well with no complications   Post-procedure details: wound care instructions given    Cheilitis (2) Left Oral Commissure; Right Upper [place] Lip  OTC clotrimazole cream and  hydrocortisone at night  Neoplasm of uncertain behavior of skin (4) right Upper Arm - Posterior  Skin / nail biopsy Type of biopsy: tangential   Informed consent: discussed and consent obtained   Timeout: patient name, date of birth, surgical site, and procedure verified   Anesthesia: the lesion was anesthetized in a standard fashion   Anesthetic:  1% lidocaine w/ epinephrine 1-100,000 local infiltration Instrument used: flexible razor blade   Hemostasis achieved with: aluminum chloride and electrodesiccation   Outcome: patient tolerated procedure well   Post-procedure details: wound care instructions given    Chest - Medial (Center)  Skin / nail biopsy Type of biopsy: tangential   Informed consent: discussed and consent obtained   Timeout: patient name, date of birth, surgical site, and procedure verified   Anesthesia: the lesion was anesthetized in a standard fashion   Anesthetic:  1% lidocaine w/ epinephrine 1-100,000 local infiltration Instrument used: flexible razor blade   Hemostasis achieved with: aluminum chloride and electrodesiccation   Outcome: patient tolerated procedure well   Post-procedure details: wound care instructions given    Left mid back  Skin / nail biopsy Type of biopsy: tangential   Informed consent: discussed and consent obtained   Timeout: patient name, date of birth, surgical site, and procedure verified   Anesthesia: the lesion was anesthetized in a standard fashion   Anesthetic:  1% lidocaine w/ epinephrine 1-100,000 local infiltration Instrument used: flexible razor blade   Hemostasis achieved with: aluminum chloride and electrodesiccation   Outcome: patient tolerated procedure well   Post-procedure details: wound care instructions given  Right mid back  Skin / nail biopsy Type of biopsy: tangential   Informed consent: discussed and consent obtained   Timeout: patient name, date of birth, surgical site, and procedure verified     Anesthesia: the lesion was anesthetized in a standard fashion   Anesthetic:  1% lidocaine w/ epinephrine 1-100,000 local infiltration Instrument used: flexible razor blade   Hemostasis achieved with: aluminum chloride and electrodesiccation   Outcome: patient tolerated procedure well   Post-procedure details: wound care instructions given    Screening exam for skin cancer TBSE  Yearly skin exam  I have reviewed the above documentation for accuracy and completeness and I agree with the above.  Robyne Askew PA-C

## 2020-04-16 NOTE — Progress Notes (Signed)
sBCC x2. Check to see if she has a 30 scheduled... done at the time of the visit I believe

## 2020-04-17 ENCOUNTER — Telehealth: Payer: Self-pay

## 2020-04-17 DIAGNOSIS — C4491 Basal cell carcinoma of skin, unspecified: Secondary | ICD-10-CM

## 2020-04-17 NOTE — Telephone Encounter (Signed)
Phone call to patient with her pathology results. Patient aware of results.  

## 2020-04-17 NOTE — Telephone Encounter (Signed)
-----   Message from Warren Danes, [place] sent at 04/16/2020  3:47 PM EDT ----- [place] x2. Check to see if she has a 30 scheduled... done at the time of the visit I believe

## 2020-07-12 ENCOUNTER — Encounter: Payer: BC Managed Care – PPO | Admitting: Physician Assistant

## 2020-12-17 DIAGNOSIS — E782 Mixed hyperlipidemia: Secondary | ICD-10-CM | POA: Diagnosis not present

## 2020-12-17 DIAGNOSIS — E039 Hypothyroidism, unspecified: Secondary | ICD-10-CM | POA: Diagnosis not present

## 2020-12-17 DIAGNOSIS — K130 Diseases of lips: Secondary | ICD-10-CM | POA: Diagnosis not present

## 2020-12-17 DIAGNOSIS — K219 Gastro-esophageal reflux disease without esophagitis: Secondary | ICD-10-CM | POA: Diagnosis not present

## 2020-12-20 ENCOUNTER — Encounter: Payer: Self-pay | Admitting: Physician Assistant

## 2020-12-20 ENCOUNTER — Other Ambulatory Visit: Payer: Self-pay

## 2020-12-20 ENCOUNTER — Ambulatory Visit (INDEPENDENT_AMBULATORY_CARE_PROVIDER_SITE_OTHER): Payer: BC Managed Care – PPO | Admitting: Physician Assistant

## 2020-12-20 DIAGNOSIS — C44519 Basal cell carcinoma of skin of other part of trunk: Secondary | ICD-10-CM

## 2020-12-20 DIAGNOSIS — C4491 Basal cell carcinoma of skin, unspecified: Secondary | ICD-10-CM

## 2020-12-20 NOTE — Progress Notes (Signed)
   Follow-Up Visit   Subjective  Kayla Patel is a 60 y.o. female who presents for the following: Procedure (Bcc x2 right mid back left mid back).   The following portions of the chart were reviewed this encounter and updated as appropriate:      Objective  Well appearing patient in no apparent distress; mood and affect are within normal limits.  A focused examination was performed including back and legs. Relevant physical exam findings are noted in the Assessment and Plan.  Objective  Left Upper Back: Pink macules  Objective  Right Lower Back: Pink macules    Assessment & Plan  Superficial basal cell carcinoma (BCC) (2) Left Upper Back  Destruction of lesion Complexity: simple   Destruction method: electrodesiccation and curettage   Informed consent: discussed and consent obtained   Timeout:  patient name, date of birth, surgical site, and procedure verified Anesthesia: the lesion was anesthetized in a standard fashion   Anesthetic:  1% lidocaine w/ epinephrine 1-100,000 local infiltration Curettage performed in three different directions: Yes   Electrodesiccation performed over the curetted area: Yes   Curettage cycles:  3 Final wound size (cm):  1.2 Hemostasis achieved with:  ferric subsulfate Outcome: patient tolerated procedure well with no complications   Additional details:  Wound innoculated with 5 fluorouracil solution.  Right Lower Back  Destruction of lesion Complexity: simple   Destruction method: electrodesiccation and curettage   Informed consent: discussed and consent obtained   Timeout:  patient name, date of birth, surgical site, and procedure verified Anesthesia: the lesion was anesthetized in a standard fashion   Anesthetic:  1% lidocaine w/ epinephrine 1-100,000 local infiltration Curettage performed in three different directions: Yes   Electrodesiccation performed over the curetted area: Yes   Curettage cycles:  3 Final wound size (cm):   1.4 Hemostasis achieved with:  ferric subsulfate Outcome: patient tolerated procedure well with no complications   Additional details:  Wound innoculated with 5 fluorouracil solution.  Recheck in 3 months & yearly skin check    I, Raelea Gosse, PA-C, have reviewed all documentation's for this visit.  The documentation on 12/20/20 for the exam, diagnosis, procedures and orders are all accurate and complete.

## 2020-12-20 NOTE — Patient Instructions (Signed)

## 2020-12-27 DIAGNOSIS — J3489 Other specified disorders of nose and nasal sinuses: Secondary | ICD-10-CM | POA: Diagnosis not present

## 2020-12-27 DIAGNOSIS — J019 Acute sinusitis, unspecified: Secondary | ICD-10-CM | POA: Diagnosis not present

## 2020-12-28 NOTE — Addendum Note (Signed)
Addended by: Robyne Askew R on: 12/28/2020 01:54 PM   Modules accepted: Level of Service

## 2021-02-28 ENCOUNTER — Ambulatory Visit (INDEPENDENT_AMBULATORY_CARE_PROVIDER_SITE_OTHER): Payer: BC Managed Care – PPO | Admitting: Obstetrics & Gynecology

## 2021-02-28 ENCOUNTER — Encounter: Payer: Self-pay | Admitting: Obstetrics & Gynecology

## 2021-02-28 ENCOUNTER — Other Ambulatory Visit: Payer: Self-pay

## 2021-02-28 VITALS — BP 124/84 | Ht 64.0 in | Wt 175.0 lb

## 2021-02-28 DIAGNOSIS — Z780 Asymptomatic menopausal state: Secondary | ICD-10-CM | POA: Diagnosis not present

## 2021-02-28 DIAGNOSIS — Z01419 Encounter for gynecological examination (general) (routine) without abnormal findings: Secondary | ICD-10-CM

## 2021-02-28 DIAGNOSIS — Z6830 Body mass index (BMI) 30.0-30.9, adult: Secondary | ICD-10-CM

## 2021-02-28 DIAGNOSIS — E6609 Other obesity due to excess calories: Secondary | ICD-10-CM

## 2021-02-28 DIAGNOSIS — K625 Hemorrhage of anus and rectum: Secondary | ICD-10-CM | POA: Diagnosis not present

## 2021-02-28 NOTE — Progress Notes (Signed)
Kayla Patel August 23, 1961 [id number]   History:    60 y.o.  G2P2L2 Single. Has 5 grand-children.  PP:[id number] presenting for annual gyn exam   ASN:[id number], well on no HRT. No PMB. No pelvic pain. Abstinent. Breasts wnl. Urine normal.  C/O mild rectal bleeding.  Last Colono 2016. BMI increased to 30.04. Difficulty loosing weight.  Mild physical activity. Health Labs with Fam MD.  Past medical history,surgical history, family history and social history were all reviewed and documented in the EPIC chart.  Gynecologic History Patient's last menstrual period was 01/24/2015.  Obstetric History OB History  Gravida Para Term Preterm AB Living  2 2 2     2   SAB IAB Ectopic Multiple Live Births               # Outcome Date GA Lbr Len/2nd Weight Sex Delivery Anes PTL Lv  2 Term           1 Term              ROS: A ROS was performed and pertinent positives and negatives are included in the history.  GENERAL: No fevers or chills. HEENT: No change in vision, no earache, sore throat or sinus congestion. NECK: No pain or stiffness. CARDIOVASCULAR: No chest pain or pressure. No palpitations. PULMONARY: No shortness of breath, cough or wheeze. GASTROINTESTINAL: No abdominal pain, nausea, vomiting or diarrhea, melena or bright red blood per rectum. GENITOURINARY: No urinary frequency, urgency, hesitancy or dysuria. MUSCULOSKELETAL: No joint or muscle pain, no back pain, no recent trauma. DERMATOLOGIC: No rash, no itching, no lesions. ENDOCRINE: No polyuria, polydipsia, no heat or cold intolerance. No recent change in weight. HEMATOLOGICAL: No anemia or easy bruising or bleeding. NEUROLOGIC: No headache, seizures, numbness, tingling or weakness. PSYCHIATRIC: No depression, no loss of interest in normal activity or change in sleep pattern.     Exam:   BP 124/84   Ht 5\' 4"  (1.626 m)   Wt 175 lb (79.4 kg)   LMP 01/24/2015   BMI 30.04 kg/m   Body mass index is  30.04 kg/m.  General appearance : Well developed well nourished female. No acute distress HEENT: Eyes: no retinal hemorrhage or exudates,  Neck supple, trachea midline, no carotid bruits, no thyroidmegaly Lungs: Clear to auscultation, no rhonchi or wheezes, or rib retractions  Heart: Regular rate and rhythm, no murmurs or gallops Breast:Examined in sitting and supine position were symmetrical in appearance, no palpable masses or tenderness,  no skin retraction, no nipple inversion, no nipple discharge, no skin discoloration, no axillary or supraclavicular lymphadenopathy Abdomen: no palpable masses or tenderness, no rebound or guarding Extremities: no edema or skin discoloration or tenderness  Pelvic: Vulva: Normal             Vagina: No gross lesions or discharge  Cervix: No gross lesions or discharge.  Pap reflex done.  Uterus AV, normal size, shape and consistency, non-tender and mobile  Adnexa  Without masses or tenderness  Anus: Normal   Assessment/Plan:  60 y.o. female for annual exam   1. Encounter for routine gynecological examination with Papanicolaou smear of cervix Normal gynecologic exam.  Pap reflex done.  Breasts normal.  Screening mammo to schedule.  Health labs with [facility].  2. Postmenopausal Well on no HRT.  No PMB.  Vit D, Ca++ 1.5 g/d total, regular weight bearing physical activities.  3. Rectal bleeding Refer to [facility] for investigation and management.  4. Class 1 obesity due  to excess calories without serious comorbidity with body mass index (BMI) of 30.0 to 30.9 in adult Low calorie/carb diet recommended.  Aerobic activities 5 times a week with light weight lifting every 2 days.  Princess Bruins MD, 3:59 PM 02/28/2021

## 2021-03-01 LAB — PAP IG W/ RFLX HPV ASCU

## 2021-03-10 ENCOUNTER — Encounter: Payer: Self-pay | Admitting: Obstetrics & Gynecology

## 2021-03-11 ENCOUNTER — Telehealth: Payer: Self-pay | Admitting: *Deleted

## 2021-03-11 DIAGNOSIS — K625 Hemorrhage of anus and rectum: Secondary | ICD-10-CM

## 2021-03-11 NOTE — Telephone Encounter (Signed)
Patient referral placed at [place] they will call to schedule.

## 2021-03-11 NOTE — Telephone Encounter (Signed)
-----   Message from Princess Bruins, MD sent at 03/10/2021 11:17 AM EDT ----- Regarding: Refer to Gastro C/O Rectal bleeding.  Last Colono 2016. Refer to [facility] for investigation and management.

## 2021-03-12 NOTE — Telephone Encounter (Signed)
Left detailed message on cell per DPR access referral has been placed and she may call if ready to schedule now, otherwise [place] will call her.

## 2021-03-14 NOTE — Telephone Encounter (Signed)
Patient scheduled on 04/18/21

## 2021-03-20 ENCOUNTER — Ambulatory Visit (INDEPENDENT_AMBULATORY_CARE_PROVIDER_SITE_OTHER): Payer: BC Managed Care – PPO | Admitting: Physician Assistant

## 2021-03-20 ENCOUNTER — Encounter: Payer: Self-pay | Admitting: Physician Assistant

## 2021-03-20 ENCOUNTER — Other Ambulatory Visit: Payer: Self-pay

## 2021-03-20 DIAGNOSIS — L578 Other skin changes due to chronic exposure to nonionizing radiation: Secondary | ICD-10-CM

## 2021-03-20 DIAGNOSIS — D1800 Hemangioma unspecified site: Secondary | ICD-10-CM

## 2021-03-20 DIAGNOSIS — L814 Other melanin hyperpigmentation: Secondary | ICD-10-CM | POA: Diagnosis not present

## 2021-03-20 DIAGNOSIS — D225 Melanocytic nevi of trunk: Secondary | ICD-10-CM | POA: Diagnosis not present

## 2021-03-20 DIAGNOSIS — L821 Other seborrheic keratosis: Secondary | ICD-10-CM

## 2021-03-20 DIAGNOSIS — Z85828 Personal history of other malignant neoplasm of skin: Secondary | ICD-10-CM

## 2021-03-20 DIAGNOSIS — Z1283 Encounter for screening for malignant neoplasm of skin: Secondary | ICD-10-CM

## 2021-03-20 DIAGNOSIS — D485 Neoplasm of uncertain behavior of skin: Secondary | ICD-10-CM

## 2021-03-20 NOTE — Progress Notes (Signed)
   Follow-Up Visit   Subjective  Kayla Patel is a 60 y.o. female who presents for the following: Follow-up (3 month follow up on left mid back & right mid back.  Both lesions were BCC treated & healing well. No concerns per patient.  Per records review personal history of multiple non mole skin cancers.  ).   The following portions of the chart were reviewed this encounter and updated as appropriate:  Tobacco  Allergies  Meds  Problems  Med Hx  Surg Hx  Fam Hx      Objective  Well appearing patient in no apparent distress; mood and affect are within normal limits.  All skin waist up examined.  Objective  Mid Back: Pink papule. No measurement or photo per ks  Objective  Left Lower Back, Right Lower Back: Scars clear with post inflammatory hyperpigmentation.    Assessment & Plan  Neoplasm of uncertain behavior of skin Mid Back  Skin / nail biopsy Type of biopsy: tangential   Informed consent: discussed and consent obtained   Timeout: patient name, date of birth, surgical site, and procedure verified   Procedure prep:  Patient was prepped and draped in usual sterile fashion (Non sterile) Prep type:  Chlorhexidine Anesthesia: the lesion was anesthetized in a standard fashion   Anesthetic:  1% lidocaine w/ epinephrine 1-100,000 local infiltration Instrument used: flexible razor blade   Outcome: patient tolerated procedure well   Post-procedure details: wound care instructions given    Specimen 1 - Surgical pathology Differential Diagnosis: r/o ISK  Check Margins: No  History of basal cell carcinoma (BCC) (2) Left Lower Back; Right Lower Back  observe  Lentigines - Scattered tan macules - Discussed due to sun exposure - Benign, observe - Call for any changes  Seborrheic Keratoses - Stuck-on, waxy, tan-brown papules and plaques  - Discussed benign etiology and prognosis. - Observe - Call for any changes  Hemangiomas - Red papules - Discussed benign  nature - Observe - Call for any changes  Actinic Damage - diffuse scaly erythematous macules with underlying dyspigmentation - Recommend daily broad spectrum sunscreen SPF 30+ to sun-exposed areas, reapply every 2 hours as needed.  - Call for new or changing lesions.  Skin cancer screening performed today.    I, Shainna Faux, PA-C, have reviewed all documentation's for this visit.  The documentation on 03/20/21 for the exam, diagnosis, procedures and orders are all accurate and complete.

## 2021-04-17 NOTE — Progress Notes (Signed)
Referring Provider: Dayton Scrape* Primary Care Physician:  Kathreen Devoid, PA-C  Reason for Consultation: Rectal bleeding   IMPRESSION:  Referred for rectal bleeding but the patient denies any GI symptoms including rectal bleeding. We discussed potential causes of rectal bleeding including polyp and cancer. She agrees to contact me in the future if any bleeding occurs to proceed with colonoscopy.  Normal screening colonoscopy with Dr. Olevia Perches 2016. Surveillance colonoscopy due 2026.  Left-sided diverticulosis: High fiber diet recommended.     PLAN: - High fiber diet, drink at least 64 ounces of water daily, consider daily Metamucil or Benefiber - Colonoscopy with any rectal bleeding, otherwise surveillance colonoscopy 2026  Please see the "Patient Instructions" section for addition details about the plan.  HPI: Kayla Patel is a 60 y.o. female referred by Dr. Dellis Filbert for further evaluation of rectal bleeding.  The history is obtained from the patient and review of her electronic health record.  She has a history of reflux, H. pylori gastritis. Last seen by GI in 2016.  Reported rectal bleeding during her recent annual evaluation with her gynecologist. However, on today's visit Kayla Patel denies any prior rectal bleeding. She denies any history of blood in the stool. GI ROS is negative except for frustration with weight gain. She has started an intermittent fasting diet as recommended by Dr. Dellis Filbert.   Screening colonoscopy with Dr. Olevia Perches 05/11/2015 showed moderate left-sided diverticulosis.  The exam was otherwise normal.  Repeat colonoscopy recommended in 10 years.  No known family history of colon cancer or polyps. No family history of uterine/endometrial cancer, pancreatic cancer or gastric/stomach cancer.   Past Medical History:  Diagnosis Date  . Basal cell carcinoma 07/04/2014   left thigh tx cx3 63fu  . BCC (basal cell carcinoma of skin) 07/04/2014    left outer calf  tx cx3 23fu  . BCC (basal cell carcinoma of skin) 07/04/2014   right upper arm tx cx3 74fu  . BCC (basal cell carcinoma of skin) 11/23/2014   upper left arm tx cx3 5 fu  . BCC (basal cell carcinoma of skin) 12/03/2014   upper right arm tx cx3 36f  . BCC (basal cell carcinoma of skin) 12/21/2014   left post ankle tx with bx  . Shingles   . Superficial basal cell carcinoma (BCC) 04/12/2020   Left Mid Back(CX35FU)  . Superficial basal cell carcinoma (BCC) 04/12/2020   Right Mid Back(CX35FU)  . Thyroid disease     Past Surgical History:  Procedure Laterality Date  . SKIN CANCER DESTRUCTION    . TUBAL LIGATION      Current Outpatient Medications  Medication Sig Dispense Refill  . levothyroxine (SYNTHROID) 25 MCG tablet Take 25 mcg by mouth once a week. Saturday,    . SYNTHROID 75 MCG tablet Take 75 mcg by mouth daily. Takes 163mcg Monday and Wednesday and the rest of the days 72mcg  0   No current facility-administered medications for this visit.    Allergies as of 04/18/2021  . (No Known Allergies)    Family History  Problem Relation Age of Onset  . Hodgkin's lymphoma Paternal Grandmother   . Colon cancer Neg Hx      Review of Systems: 12 system ROS is negative except as noted above.   Physical Exam: General:   Alert,  well-nourished, pleasant and cooperative in NAD Head:  Normocephalic and atraumatic. Eyes:  Sclera clear, no icterus.   Conjunctiva pink. Abdomen:  Soft, nontender, nondistended, normal bowel  sounds, no rebound or guarding. No hepatosplenomegaly.   Neurologic:  Alert and  oriented x4;  grossly nonfocal Skin:  Intact without significant lesions or rashes. Psych:  Alert and cooperative. Normal mood and affect.    Christianjames Soule L. Tarri Glenn, MD, MPH 04/18/2021, 10:26 AM

## 2021-04-18 ENCOUNTER — Encounter: Payer: Self-pay | Admitting: Gastroenterology

## 2021-04-18 ENCOUNTER — Ambulatory Visit (INDEPENDENT_AMBULATORY_CARE_PROVIDER_SITE_OTHER): Payer: BC Managed Care – PPO | Admitting: Gastroenterology

## 2021-04-18 ENCOUNTER — Other Ambulatory Visit: Payer: Self-pay

## 2021-04-18 VITALS — BP 114/60 | HR 68 | Ht 64.0 in | Wt 169.4 lb

## 2021-04-18 DIAGNOSIS — K5730 Diverticulosis of large intestine without perforation or abscess without bleeding: Secondary | ICD-10-CM | POA: Diagnosis not present

## 2021-04-18 DIAGNOSIS — K625 Hemorrhage of anus and rectum: Secondary | ICD-10-CM | POA: Diagnosis not present

## 2021-04-18 NOTE — Patient Instructions (Addendum)
It was a pleasure to meet you today. Based on our discussion, I am providing you with my recommendations below:  RECOMMENDATION(S):   It is very important that we address any rectal bleeding concerns ASAP. You mentioned that you do not have any active bleeding nor have had any bleeding in the past. I did not make any changes to your regimen today. Please call the office ASAP if you do develop any signs of rectal bleeding. To better evaluate, I will recommend a colonoscopy. However, with you being asymptomatic, it is unneseccary to schedule a colonoscopy until 2026.  FOLLOW UP:  . I would like for you to follow up with me as needed. Should you develop any symptoms, please call the office at [phone] to schedule your appointment.  BMI:  . If you are age 58 or younger, your body mass index should be between 19-25. Your Body mass index is 29.07 kg/m. If this is out of the aformentioned range listed, please consider follow up with your Primary Care Provider.   Thank you for trusting me with your gastrointestinal care!    Thornton Park, MD, MPH

## 2021-07-19 DIAGNOSIS — N3000 Acute cystitis without hematuria: Secondary | ICD-10-CM | POA: Diagnosis not present

## 2022-03-25 ENCOUNTER — Encounter: Payer: Self-pay | Admitting: Physician Assistant

## 2022-03-25 ENCOUNTER — Ambulatory Visit (INDEPENDENT_AMBULATORY_CARE_PROVIDER_SITE_OTHER): Payer: BC Managed Care – PPO | Admitting: Physician Assistant

## 2022-03-25 DIAGNOSIS — D225 Melanocytic nevi of trunk: Secondary | ICD-10-CM | POA: Diagnosis not present

## 2022-03-25 DIAGNOSIS — D2239 Melanocytic nevi of other parts of face: Secondary | ICD-10-CM

## 2022-03-25 DIAGNOSIS — Z85828 Personal history of other malignant neoplasm of skin: Secondary | ICD-10-CM | POA: Diagnosis not present

## 2022-03-25 DIAGNOSIS — K130 Diseases of lips: Secondary | ICD-10-CM

## 2022-03-25 DIAGNOSIS — D224 Melanocytic nevi of scalp and neck: Secondary | ICD-10-CM

## 2022-03-25 DIAGNOSIS — D485 Neoplasm of uncertain behavior of skin: Secondary | ICD-10-CM

## 2022-03-25 NOTE — Progress Notes (Signed)
? ?  Follow-Up Visit ?  ?Subjective  ?Kayla Patel is a 61 y.o. female who presents for the following: Skin Problem (Wants mole removed on left jawline and right preauricular & dry lips- crack. Personal history of non mole skin cancer but no melanoma. ). ? ? ?The following portions of the chart were reviewed this encounter and updated as appropriate:  Tobacco  Allergies  Meds  Problems  Med Hx  Surg Hx  Fam Hx   ?  ? ?Objective  ?Well appearing patient in no apparent distress; mood and affect are within normal limits. ? ?A full examination was performed including scalp, head, eyes, ears, nose, lips, neck, chest, axillae, abdomen, back, buttocks, bilateral upper extremities, bilateral lower extremities, hands, feet, fingers, toes, fingernails, and toenails. All findings within normal limits unless otherwise noted below. ? ?Left Anterior Neck ?Soft papule ? ? ? ? ? ? ?Right Preauricular Area ?Soft papule ? ? ? ? ? ? ?Left Oral Commissure, Right Oral Commissure ?Erythematous fold ? ? ?Assessment & Plan  ?Neoplasm of uncertain behavior of skin (2) ?Left Anterior Neck ? ?Skin / nail biopsy ?Type of biopsy: tangential   ?Informed consent: discussed and consent obtained   ?Timeout: patient name, date of birth, surgical site, and procedure verified   ?Procedure prep:  Patient was prepped and draped in usual sterile fashion (Non sterile) ?Prep type:  Chlorhexidine ?Anesthesia: the lesion was anesthetized in a standard fashion   ?Anesthetic:  1% lidocaine w/ epinephrine 1-100,000 local infiltration ?Instrument used: flexible razor blade   ?Outcome: patient tolerated procedure well   ?Post-procedure details: wound care instructions given   ? ?Specimen 1 - Surgical pathology ?Differential Diagnosis: r/o cn ? ?Check Margins: No ? ?Right Preauricular Area ? ?Skin / nail biopsy ?Type of biopsy: tangential   ?Informed consent: discussed and consent obtained   ?Timeout: patient name, date of birth, surgical site, and  procedure verified   ?Procedure prep:  Patient was prepped and draped in usual sterile fashion (Non sterile) ?Prep type:  Chlorhexidine ?Anesthesia: the lesion was anesthetized in a standard fashion   ?Anesthetic:  1% lidocaine w/ epinephrine 1-100,000 local infiltration ?Instrument used: flexible razor blade   ?Outcome: patient tolerated procedure well   ?Post-procedure details: wound care instructions given   ? ?Specimen 2 - Surgical pathology ?Differential Diagnosis: r/o cn ? ?Check Margins: No ? ?Angular cheilitis ?Left Oral Commissure; Right Oral Commissure ? ?Otc clotrimazole and hydrocortisone ? ?All scars clear ? ?I, Rubel Heckard, PA-C, have reviewed all documentation's for this visit.  The documentation on 03/25/22 for the exam, diagnosis, procedures and orders are all accurate and complete. ?

## 2022-03-25 NOTE — Patient Instructions (Addendum)
?  Over the counter- clotrimazole  ? ? ? ?Seborrheic Keratosis ?A seborrheic keratosis is a common, noncancerous (benign) skin growth. These growths are velvety, waxy, rough, tan, brown, or black spots that appear on the skin. These skin growths can be flat or raised, and scaly. ?What are the causes? ?The cause of this condition is not known. ?What increases the risk? ?You are more likely to develop this condition if you: ?Have a family history of seborrheic keratosis. ?Are 50 or older. ?Are pregnant. ?Have had estrogen replacement therapy. ?What are the signs or symptoms? ?Symptoms of this condition include growths on the face, chest, shoulders, back, or other areas. These growths: ?Are usually painless, but may become irritated and itchy. ?Can be yellow, brown, black, or other colors. ?Are slightly raised or have a flat surface. ?Are sometimes rough or wart-like in texture. ?Are often velvety or waxy on the surface. ?Are round or oval-shaped. ?Often occur in groups, but may occur as a single growth. ?How is this diagnosed? ?This condition is diagnosed with a medical history and physical exam. ?A sample of the growth may be tested (skin biopsy). ?You may need to see a skin specialist (dermatologist). ?How is this treated? ?Treatment is not usually needed for this condition, unless the growths are irritated or bleed often. ?You may also choose to have the growths removed if you do not like their appearance. ?Most commonly, these growths are treated with a procedure in which liquid nitrogen is applied to "freeze" off the growth (cryosurgery). ?They may also be burned off with electricity (electrocautery) or removed by scraping (curettage). ?Follow these instructions at home: ?Watch your growth for any changes. ?Keep all follow-up visits as told by your health care provider. This is important. ?Do not scratch or pick at the growth or growths. This can cause them to become irritated or infected. ?Contact a health care  provider if: ?You suddenly have many new growths. ?Your growth bleeds, itches, or hurts. ?Your growth suddenly becomes larger or changes color. ?Summary ?A seborrheic keratosis is a common, noncancerous (benign) skin growth. ?Treatment is not usually needed for this condition, unless the growths are irritated or bleed often. ?Watch your growth for any changes. ?Contact a health care provider if you suddenly have many new growths or your growth suddenly becomes larger or changes color. ?Keep all follow-up visits as told by your health care provider. This is important. ?This information is not intended to replace advice given to you by your health care provider. Make sure you discuss any questions you have with your health care provider. ?Document Revised: 04/15/2018 Document Reviewed: 04/15/2018 ?Elsevier Patient Education ? [facility]. ? ?

## 2022-09-18 DIAGNOSIS — Z1231 Encounter for screening mammogram for malignant neoplasm of breast: Secondary | ICD-10-CM | POA: Diagnosis not present

## 2022-09-18 DIAGNOSIS — E039 Hypothyroidism, unspecified: Secondary | ICD-10-CM | POA: Diagnosis not present

## 2022-09-18 DIAGNOSIS — R21 Rash and other nonspecific skin eruption: Secondary | ICD-10-CM | POA: Diagnosis not present

## 2022-09-18 DIAGNOSIS — E782 Mixed hyperlipidemia: Secondary | ICD-10-CM | POA: Diagnosis not present

## 2022-09-29 DIAGNOSIS — Z1231 Encounter for screening mammogram for malignant neoplasm of breast: Secondary | ICD-10-CM | POA: Diagnosis not present

## 2022-10-03 ENCOUNTER — Ambulatory Visit (INDEPENDENT_AMBULATORY_CARE_PROVIDER_SITE_OTHER): Payer: BC Managed Care – PPO | Admitting: Obstetrics & Gynecology

## 2022-10-03 ENCOUNTER — Other Ambulatory Visit (HOSPITAL_COMMUNITY)
Admission: RE | Admit: 2022-10-03 | Discharge: 2022-10-03 | Disposition: A | Discharge status: Home / Self Care | Payer: BC Managed Care – PPO | Source: Ambulatory Visit | Attending: Obstetrics & Gynecology | Admitting: Obstetrics & Gynecology

## 2022-10-03 ENCOUNTER — Encounter: Payer: Self-pay | Admitting: Obstetrics & Gynecology

## 2022-10-03 VITALS — BP 114/80 | HR 70 | Temp 97.6°F | Resp 16 | Ht 64.5 in | Wt 170.0 lb

## 2022-10-03 DIAGNOSIS — Z01419 Encounter for gynecological examination (general) (routine) without abnormal findings: Secondary | ICD-10-CM | POA: Diagnosis not present

## 2022-10-03 DIAGNOSIS — Z780 Asymptomatic menopausal state: Secondary | ICD-10-CM

## 2022-10-03 DIAGNOSIS — R87810 Cervical high risk human papillomavirus (HPV) DNA test positive: Secondary | ICD-10-CM | POA: Diagnosis not present

## 2022-10-03 DIAGNOSIS — R300 Dysuria: Secondary | ICD-10-CM

## 2022-10-03 MED ORDER — SULFAMETHOXAZOLE-TRIMETHOPRIM 800-160 MG PO TABS: 1.0000 | ORAL_TABLET | Freq: Two times a day (BID) | ORAL | 0 refills | Status: AC

## 2022-10-03 NOTE — Progress Notes (Signed)
Kayla Patel 1961-05-04 [id number]   History:    61 y.o. G2P2L2 Single.  Has 5 grand-children.   RP:  Established patient presenting for annual gyn exam    HPI: Postmenopause, well on no HRT.  No PMB.  No pelvic pain.  Abstinent.  H/O HR HPV Pos.  Pap Neg 02/2021.  Pap/HPV HR today.  Breasts normal.  Mammo Neg 09/2022. C/O burning with urination and pelvic/lower back discomfort x 5 days.  No fever.  BMs normal.  Last [facility] 04/2015.  BMI increased to 28.73. Successfully loosing weight.  Regular physical activity. Health Labs with Fam MD.   Past medical history,surgical history, family history and social history were all reviewed and documented in the EPIC chart.  Gynecologic History Patient's last menstrual period was 11/17/2014.  Obstetric History OB History  Gravida Para Term Preterm AB Living  '2 2 2     2  '$ SAB IAB Ectopic Multiple Live Births               # Outcome Date GA Lbr Len/2nd Weight Sex Delivery Anes PTL Lv  2 Term           1 Term              ROS: A ROS was performed and pertinent positives and negatives are included in the history.  GENERAL: No fevers or chills. HEENT: No change in vision, no earache, sore throat or sinus congestion. NECK: No pain or stiffness. CARDIOVASCULAR: No chest pain or pressure. No palpitations. PULMONARY: No shortness of breath, cough or wheeze. GASTROINTESTINAL: No abdominal pain, nausea, vomiting or diarrhea, melena or bright red blood per rectum. GENITOURINARY: No urinary frequency, urgency, hesitancy or dysuria. MUSCULOSKELETAL: No joint or muscle pain, no back pain, no recent trauma. DERMATOLOGIC: No rash, no itching, no lesions. ENDOCRINE: No polyuria, polydipsia, no heat or cold intolerance. No recent change in weight. HEMATOLOGICAL: No anemia or easy bruising or bleeding. NEUROLOGIC: No headache, seizures, numbness, tingling or weakness. PSYCHIATRIC: No depression, no loss of interest in normal activity or change in sleep pattern.      Exam:   BP 114/80   Pulse 70   Temp 97.6 F (36.4 C) (Oral)   Resp 16   Ht 5' 4.5" (1.638 m)   Wt 170 lb (77.1 kg)   LMP 11/17/2014 Comment: BTL  BMI 28.73 kg/m   Body mass index is 28.73 kg/m.  General appearance : Well developed well nourished female. No acute distress HEENT: Eyes: no retinal hemorrhage or exudates,  Neck supple, trachea midline, no carotid bruits, no thyroidmegaly Lungs: Clear to auscultation, no rhonchi or wheezes, or rib retractions  Heart: Regular rate and rhythm, no murmurs or gallops Breast:Examined in sitting and supine position were symmetrical in appearance, no palpable masses or tenderness,  no skin retraction, no nipple inversion, no nipple discharge, no skin discoloration, no axillary or supraclavicular lymphadenopathy Abdomen: no palpable masses or tenderness, no rebound or guarding Extremities: no edema or skin discoloration or tenderness  Pelvic: Vulva: Normal             Vagina: No gross lesions or discharge  Cervix: No gross lesions or discharge.  Pap/HPV HR Pos.  Uterus  AV, normal size, shape and consistency, non-tender and mobile  Adnexa  Without masses or tenderness  Anus: Normal  U/A:  Yellow clear, Pro Neg, Nit Neg, Blood 2+, RBC 3-10, WBC 0-5, Bacteria Few.  Pending U. Culture.   Assessment/Plan:  61  y.o. female for annual exam   1. Encounter for routine gynecological examination with Papanicolaou smear of cervix Postmenopause, well on no HRT.  No PMB.  No pelvic pain.  Abstinent.  H/O HR HPV Pos.  Pap Neg 02/2021.  Pap/HPV HR today.  Breasts normal.  Mammo Neg 09/2022. C/O burning with urination and pelvic/lower back discomfort x 5 days.  No fever.  BMs normal.  Last [facility] 04/2015.  BMI increased to 28.73. Successfully loosing weight.  Regular physical activity. Health Labs with Fam MD. - Cytology - PAP( [facility])  2. Cervical high risk HPV (human papillomavirus) test positive HR HPV testing today.  3.  Postmenopausal Postmenopause, well on no HRT.  No PMB.  No pelvic pain.  Abstinent.   4. Burning with urination C/O burning with urination and pelvic/lower back discomfort x 5 days.  No fever. U/A mildly abnormal.  Will treat with Bactrim DS.  Usage reviewed, prescription to pharmacy. - Urinalysis,Complete w/RFL Culture  Other orders - clotrimazole-betamethasone (LOTRISONE) cream; Apply topically. - Multiple Vitamin (MULTIVITAMIN PO); Take by mouth. occ - sulfamethoxazole-trimethoprim (BACTRIM DS) 800-160 MG tablet; Take 1 tablet by mouth 2 (two) times daily for 3 days.   Princess Bruins MD, 8:23 AM 10/03/2022

## 2022-10-06 LAB — URINE CULTURE
MICRO NUMBER:: 14079328
SPECIMEN QUALITY:: ADEQUATE

## 2022-10-06 LAB — URINALYSIS, COMPLETE W/RFL CULTURE
Bilirubin Urine: NEGATIVE
Glucose, UA: NEGATIVE
Hyaline Cast: NONE SEEN /LPF
Ketones, ur: NEGATIVE
Leukocyte Esterase: NEGATIVE
Nitrites, Initial: NEGATIVE
Protein, ur: NEGATIVE
Specific Gravity, Urine: 1.01 (ref 1.001–1.035)
pH: 5.5 (ref 5.0–8.0)

## 2022-10-06 LAB — CYTOLOGY - PAP
Comment: NEGATIVE
Diagnosis: NEGATIVE
High risk HPV: NEGATIVE

## 2022-10-06 LAB — CULTURE INDICATED

## 2022-10-20 DIAGNOSIS — J069 Acute upper respiratory infection, unspecified: Secondary | ICD-10-CM | POA: Diagnosis not present

## 2022-10-20 DIAGNOSIS — J019 Acute sinusitis, unspecified: Secondary | ICD-10-CM | POA: Diagnosis not present

## 2022-10-20 DIAGNOSIS — J209 Acute bronchitis, unspecified: Secondary | ICD-10-CM | POA: Diagnosis not present

## 2022-11-26 ENCOUNTER — Ambulatory Visit: Payer: BC Managed Care – PPO | Admitting: Radiology

## 2022-11-27 ENCOUNTER — Ambulatory Visit (INDEPENDENT_AMBULATORY_CARE_PROVIDER_SITE_OTHER): Payer: BC Managed Care – PPO | Admitting: Nurse Practitioner

## 2022-11-27 VITALS — BP 124/78 | Temp 97.5°F

## 2022-11-27 DIAGNOSIS — R350 Frequency of micturition: Secondary | ICD-10-CM | POA: Diagnosis not present

## 2022-11-27 DIAGNOSIS — M545 Low back pain, unspecified: Secondary | ICD-10-CM | POA: Diagnosis not present

## 2022-11-27 DIAGNOSIS — R300 Dysuria: Secondary | ICD-10-CM | POA: Diagnosis not present

## 2022-11-27 NOTE — Progress Notes (Signed)
   Acute Office Visit  Subjective:    Patient ID: Kayla Patel, female    DOB: 15-Sep-1961, 61 y.o.   MRN: [id number]   HPI 61 y.o. presents today for urinary frequency, urgency, bilateral hip pain, and lower back pain that radiates down legs. Was treated for UTI 10/03/2022. Symptoms have been present x 2 weeks. Describes pain in back and lower legs as achy, worse with activity, better with Tylenol and Ibuprofen, some weakness/heaviness in bilateral legs. Does lots of heavy lifting at work. Denies vaginal symptoms. Has tried Tylenol, Ibuprofen, epsom salt baths, AZO. She has significantly increased her water intake the last couple of weeks.    Review of Systems  Constitutional: Negative.  Negative for chills and fever.  Genitourinary:  Positive for frequency and urgency. Negative for difficulty urinating, dysuria, hematuria, pelvic pain, vaginal discharge and vaginal pain.  Musculoskeletal:  Positive for back pain (Lower) and myalgias (Bialteral hips, upper legs).  Neurological:  Positive for weakness (in lower extremities). Negative for numbness.       Objective:    Physical Exam Constitutional:      Appearance: Normal appearance.  Abdominal:     Palpations: Abdomen is soft.     Tenderness: There is no abdominal tenderness. There is no right CVA tenderness, left CVA tenderness, guarding or rebound.  Genitourinary:    General: Normal vulva.  Musculoskeletal:     Right hip: No tenderness or bony tenderness. Normal range of motion.     Left hip: No tenderness or bony tenderness. Normal range of motion.     Right upper leg: No swelling or edema.     Left upper leg: No swelling or edema.  Neurological:     Sensory: Sensation is intact.     Gait: Gait is intact.     BP 124/78 (BP Location: Left Arm, Patient Position: Sitting, Cuff Size: Normal)   Temp (!) 97.5 F (36.4 C) (Oral)   LMP 11/17/2014 Comment: BTL Wt Readings from Last 3 Encounters:  10/03/22 170 lb (77.1 kg)   04/18/21 169 lb 6 oz (76.8 kg)  02/28/21 175 lb (79.4 kg)       UA: neg leukocytes, neg nitrites, neg protein, 2+ blood, yellow/clear. Microscopic: wbc 0-5, rbc 10-20, no bacteria  Assessment & Plan:   Problem List Items Addressed This Visit   None Visit Diagnoses     Acute bilateral low back pain, unspecified whether sciatica present    -  Primary   Relevant Medications   Acetaminophen (TYLENOL PO)   IBUPROFEN PO   Urinary frequency       Relevant Orders   Urinalysis,Complete w/RFL Culture (Completed)   Urine Culture   REFLEXIVE URINE CULTURE (Completed)      Plan: UA unremarkable. Frequency likely due to increased water intake. Appears to be musculoskeletal in nature. Will call PCP today for evaluation.      Tamela Gammon DNP, 8:55 AM 11/27/2022

## 2022-11-28 LAB — URINE CULTURE
MICRO NUMBER:: 14315350
Result:: NO GROWTH
SPECIMEN QUALITY:: ADEQUATE

## 2022-11-28 LAB — URINALYSIS, COMPLETE W/RFL CULTURE
Bacteria, UA: NONE SEEN /HPF
Bilirubin Urine: NEGATIVE
Glucose, UA: NEGATIVE
Hyaline Cast: NONE SEEN /LPF
Ketones, ur: NEGATIVE
Leukocyte Esterase: NEGATIVE
Nitrites, Initial: NEGATIVE
Protein, ur: NEGATIVE
Specific Gravity, Urine: 1.02 (ref 1.001–1.035)
pH: 7 (ref 5.0–8.0)

## 2022-11-28 LAB — CULTURE INDICATED

## 2023-03-26 ENCOUNTER — Ambulatory Visit: Payer: BC Managed Care – PPO | Admitting: Physician Assistant

## 2023-10-14 ENCOUNTER — Ambulatory Visit: Payer: BC Managed Care – PPO | Admitting: Obstetrics & Gynecology
